# Patient Record
Sex: Male | Born: 1990 | Race: White | Hispanic: No | Marital: Single | Smoking: Never smoker
Health system: Southern US, Community
[De-identification: ages and names within clinical notes are randomized; demographics above are authoritative.]

---

## 2016-06-10 ENCOUNTER — Encounter (HOSPITAL_COMMUNITY): Payer: Self-pay | Admitting: Family Medicine

## 2016-06-10 ENCOUNTER — Ambulatory Visit (HOSPITAL_COMMUNITY)
Admission: EM | Admit: 2016-06-10 | Discharge: 2016-06-10 | Disposition: A | Discharge status: Home / Self Care | Payer: Self-pay | Attending: Emergency Medicine | Admitting: Emergency Medicine

## 2016-06-10 ENCOUNTER — Ambulatory Visit (INDEPENDENT_AMBULATORY_CARE_PROVIDER_SITE_OTHER): Payer: Self-pay

## 2016-06-10 DIAGNOSIS — M25562 Pain in left knee: Secondary | ICD-10-CM

## 2016-06-10 MED ORDER — TRAMADOL HCL 50 MG PO TABS: 50.0000 mg | ORAL_TABLET | Freq: Four times a day (QID) | ORAL | 0 refills | Status: DC | PRN

## 2016-06-10 MED ORDER — PREDNISOLONE 5 MG (48) PO TBPK: 5.0000 mg | ORAL_TABLET | Freq: Every day | ORAL | 0 refills | Status: DC

## 2016-06-10 MED ORDER — PREDNISONE 5 MG (48) PO TBPK: 5.0000 mg | ORAL_TABLET | Freq: Every day | ORAL | 0 refills | Status: DC

## 2016-06-10 MED ORDER — DOXYCYCLINE HYCLATE 100 MG PO CAPS: 100.0000 mg | ORAL_CAPSULE | Freq: Two times a day (BID) | ORAL | 0 refills | Status: DC

## 2016-06-10 NOTE — ED Triage Notes (Signed)
Pt here for right knee pain that started Friday. Denies injury. sts more on the inside of his knee.

## 2016-06-10 NOTE — Discharge Instructions (Signed)
Im worried you have an infection or inflammatory process. Please start both the prednisone and antibiotic tonight and take as directed. Call the orthopedic tomorrow and get in ASAP. If you worsen over night, fevers or more swelling, please go to the ED for emergent evaluation.

## 2016-06-10 NOTE — ED Provider Notes (Signed)
CSN: [id number]     Arrival date & time 06/10/16  1445 History   First MD Initiated Contact with Patient 06/10/16 1547     Chief Complaint  Patient presents with  . Knee Pain   (Consider location/radiation/quality/duration/timing/severity/associated sxs/prior Treatment) 25 yo presents with right knee pain x 2 days. He reports no injury. He awoke to a "very painful", swollen and hot knee. It is painful to flex and walk. He reports a small cut to his knee recently, but no ongoing issues. No recent tick bite, no worries regarding STDs. No known exposures. No fever or chills and otherwise feels well.       History reviewed. No pertinent past medical history. History reviewed. No pertinent surgical history. History reviewed. No pertinent family history. Social History  Substance Use Topics  . Smoking status: Never Smoker  . Smokeless tobacco: Never Used  . Alcohol use Not on file    Review of Systems  Constitutional: Negative for chills, fatigue and fever.  Musculoskeletal: Positive for arthralgias.  Skin: Positive for color change. Negative for rash and wound.    Allergies  Patient has no known allergies.  Home Medications   Prior to Admission medications   Medication Sig Start Date End Date Taking? Authorizing Provider  doxycycline (VIBRAMYCIN) 100 MG capsule Take 1 capsule (100 mg total) by mouth 2 (two) times daily. 06/10/16   Riki SheerMichelle G Efrata Brunner, PA-C  predniSONE (STERAPRED UNI-PAK 48 TAB) 5 MG (48) TBPK tablet Take 1 tablet (5 mg total) by mouth daily. 06/10/16   Riki SheerMichelle G Toran Murch, PA-C  traMADol (ULTRAM) 50 MG tablet Take 1 tablet (50 mg total) by mouth every 6 (six) hours as needed. 06/10/16   Riki SheerMichelle G Athol Bolds, PA-C   Meds Ordered and Administered this Visit  Medications - No data to display  BP 124/79   Pulse 86   Temp 98.1 F (36.7 C) (Oral)   Resp 18   SpO2 98%  No data found.   Physical Exam  Constitutional: He is oriented to person, place, and time. He appears  well-developed and well-nourished. No distress.  HENT:  Head: Normocephalic and atraumatic.  Musculoskeletal:  Right knee with mild soft tissue swelling, warm to touch, no definite effusion, non-tender to palpation, painful and resisted flexion, Healed wound to right anterior knee. No right lower leg edema is noted  Neurological: He is alert and oriented to person, place, and time.  Skin: Skin is warm and dry. He is not diaphoretic. There is erythema.  See M/S exam  Nursing note and vitals reviewed.   Urgent Care Course   Clinical Course     Procedures (including critical care time)  Labs Review Labs Reviewed - No data to display  Imaging Review Dg Knee Complete 4 Views Right  Result Date: 06/10/2016 CLINICAL DATA:  Initial evaluation for acute right knee pain for 2 days. EXAM: RIGHT KNEE - COMPLETE 4+ VIEW COMPARISON:  None. FINDINGS: No acute fracture or dislocation. No significant joint effusion. Osteochondral defect noted at the distal aspect of the lateral femoral condyle al. Mild degenerative osteoarthritic changes within the patellofemoral joint space compartment. There is calcification adjacent to the medial femoral condyles, compatible with a Pellegrini-Stieda lesion, suggesting prior medial collateral ligament injury. No acute soft tissue swelling. Osseous mineralization normal. IMPRESSION: 1. No acute fracture or dislocation. 2. Calcification adjacent to the medial femoral condyle, suggesting prior medial collateral ligament injury (Pellegrini-Stieda lesion). 3. Small osteochondral defect at the distal lateral femoral condyle. 4. Mild patellofemoral  degenerative osteoarthrosis. Electronically Signed   By: Rise MuBenjamin  McClintock M.D.   On: 06/10/2016 16:19     Visual Acuity Review  Right Eye Distance:   Left Eye Distance:   Bilateral Distance:    Right Eye Near:   Left Eye Near:    Bilateral Near:         MDM   1. Acute pain of left knee    Discussed with Dr.  Piedad ClimesHonig. Worried about an inflammatory process vs cellulitis. No distinct effusion is noted and no indication for aspiration. Elected to treat empirically for cellulitis with antibiotic and also treat with prednisone for inflammation. We recommend urgent evaluation with Orthopedics tomorrow, but if concerns of worsening swelling or fevers, then go to the ED for further evaluation. Xray reports are reviewed and appear chronic to this current situation. Further eval by Ortho.     Riki SheerMichelle G Trecia Maring, PA-C 06/10/16 [phone]

## 2016-09-18 ENCOUNTER — Ambulatory Visit (INDEPENDENT_AMBULATORY_CARE_PROVIDER_SITE_OTHER): Payer: PRIVATE HEALTH INSURANCE | Admitting: Urgent Care

## 2016-09-18 VITALS — BP 114/94 | HR 84 | Temp 98.1°F | Resp 18 | Ht 72.0 in | Wt 174.0 lb

## 2016-09-18 DIAGNOSIS — K120 Recurrent oral aphthae: Secondary | ICD-10-CM | POA: Diagnosis not present

## 2016-09-18 DIAGNOSIS — J3089 Other allergic rhinitis: Secondary | ICD-10-CM

## 2016-09-18 DIAGNOSIS — H6983 Other specified disorders of Eustachian tube, bilateral: Secondary | ICD-10-CM

## 2016-09-18 DIAGNOSIS — H9201 Otalgia, right ear: Secondary | ICD-10-CM

## 2016-09-18 MED ORDER — CHLORHEXIDINE GLUCONATE 0.12 % MT SOLN
15.0000 mL | Freq: Two times a day (BID) | OROMUCOSAL | 0 refills | Status: DC
Start: 2016-09-18 — End: 2018-04-01

## 2016-09-18 MED ORDER — CETIRIZINE HCL 10 MG PO TABS: 10.0000 mg | ORAL_TABLET | Freq: Every day | ORAL | 11 refills | Status: DC

## 2016-09-18 MED ORDER — FLUTICASONE PROPIONATE 50 MCG/ACT NA SUSP: 2.0000 | Freq: Every day | NASAL | 11 refills | Status: DC

## 2016-09-18 MED ORDER — PSEUDOEPHEDRINE HCL ER 120 MG PO TB12: 120.0000 mg | ORAL_TABLET | Freq: Two times a day (BID) | ORAL | 3 refills | Status: DC

## 2016-09-18 NOTE — Progress Notes (Signed)
    MRN: [id number] DOB: 16-Jun-1991  Subjective:   Dustin Griffin is a 26 y.o. male, new to our practice, presenting for chief complaint of Ear Fullness and Otitis Media (Possible)  Reports 2 day history of decreased hearing in right ear with intermittent pain. Also started seeing mildly painful ulcer over his inner bottom and upper lips, sore throat, sinus congestion and runny nose. Has tried ibuprofen and Mucinex (for sinuses) with minimal relief. Patient practices good oral hygiene. Denies fever, sinus pain, ear drainage, tinnitus, redness or swelling of outer ear. Denies smoking cigarettes.   Dustin Griffin is not currently taking any medications.  Also has No Known Allergies.  Dustin Griffin has history of allergies. Also denies past surgical history.  Objective:   Vitals: BP (!) 114/94   Pulse 84   Temp 98.1 F (36.7 C) (Oral)   Resp 18   Ht 6' (1.829 m)   Wt 174 lb (78.9 kg)   SpO2 100%   BMI 23.60 kg/m   Physical Exam  Constitutional: He is oriented to person, place, and time. He appears well-developed and well-nourished.  HENT:  TM's flat but intact bilaterally, no effusions or erythema. Nasal turbinates pink and moist, nasal passages patent. No sinus tenderness. Inner bottom lip with mid-left sided aphthous ulcer. Inner upper lip with mid-left sided aphthous ulcer spreading to adjacent upper gum line. There is no erythema, drainage of pus or bleeding. Mucous membranes moist, dentition in good repair.  Eyes: Right eye exhibits no discharge. Left eye exhibits no discharge.  Neck: Normal range of motion. Neck supple.  Cardiovascular: Normal rate, regular rhythm and intact distal pulses.  Exam reveals no gallop and no friction rub.   No murmur heard. Pulmonary/Chest: No respiratory distress. He has no wheezes. He has no rales.  Lymphadenopathy:    He has no cervical adenopathy.  Neurological: He is alert and oriented to person, place, and time.  Skin: Skin is warm and dry.     Assessment and Plan :   1. Right ear pain 2. Dysfunction of both eustachian tubes 3. Allergic rhinitis due to other allergic trigger, unspecified chronicity, unspecified seasonality - Start allergy treatment, use Sudafed as needed. Recheck in 5 days if no improvement.  4. Aphthous ulcer of mouth - Use chlorhexadine mouth rinses.  Wallis BambergMario Camary Sosa, PA-C [facility] [phone] 09/18/2016  3:51 PM

## 2016-09-18 NOTE — Patient Instructions (Addendum)
Canker Sores Introduction Canker sores are small, painful sores that develop inside your mouth. They may also be called aphthous ulcers. You can get canker sores on the inside of your lips or cheeks, on your tongue, or anywhere inside your mouth. You can have just one canker sore or several of them. Canker sores cannot be passed from one person to another (noncontagious). These sores are different than the sores that you may get on the outside of your lips (cold sores or fever blisters). Canker sores usually start as painful red bumps. Then they turn into small white, yellow, or gray ulcers that have red borders. The ulcers may be quite painful. The pain may be worse when you eat or drink. What are the causes? The cause of this condition is not known. What increases the risk? This condition is more likely to develop in:  Women.  People in their teens or 49s.  Women who are having their menstrual period.  People who are under a lot of emotional stress.  People who do not get enough iron or B vitamins.  People who have poor oral hygiene.  People who have an injury inside the mouth. This can happen after having dental work or from chewing something hard. What are the signs or symptoms? Along with the canker sore, symptoms may also include:  Fever.  Fatigue.  Swollen lymph nodes in your neck. How is this diagnosed? This condition can be diagnosed based on your symptoms. Your health care provider will also examine your mouth. Your health care provider may also do tests if you get canker sores often or if they are very bad. Tests may include:  Blood tests to rule out other causes of canker sores.  Taking swabs from the sore to check for infection.  Taking a small piece of skin from the sore (biopsy) to test it for cancer. How is this treated? Most canker sores clear up without treatment in about 10 days. Home care is usually the only treatment that you will need. Over-the-counter  medicines can relieve discomfort.If you have severe canker sores, your health care provider may prescribe:  Numbing ointment to relieve pain.  Vitamins.  Steroid medicines. These may be given as:  Oral pills.  Mouth rinses.  Gels.  Antibiotic mouth rinse. Follow these instructions at home:  Apply, take, or use medicines only as directed by your health care provider. These include vitamins.  If you were prescribed an antibiotic mouth rinse, finish all of it even if you start to feel better.  Until the sores are healed:  Do not drink coffee or citrus juices.  Do not eat spicy or salty foods.  Use a mild, over-the-counter mouth rinse as directed by your health care provider.  Practice good oral hygiene.  Floss your teeth every day.  Brush your teeth with a soft brush twice each day. Contact a health care provider if:  Your symptoms do not get better after two weeks.  You also have a fever or swollen glands.  You get canker sores often.  You have a canker sore that is getting larger.  You cannot eat or drink due to your canker sores. This information is not intended to replace advice given to you by your health care provider. Make sure you discuss any questions you have with your health care provider. Document Released: 11/17/2010 Document Revised: 12/29/2015 Document Reviewed: 06/23/2014  2017 Elsevier   Eustachian Tube Dysfunction Introduction The eustachian tube connects the middle ear to  the back of the nose. It regulates air pressure in the middle ear by allowing air to move between the ear and nose. It also helps to drain fluid from the middle ear space. When the eustachian tube does not function properly, air pressure, fluid, or both can build up in the middle ear. Eustachian tube dysfunction can affect one or both ears. What are the causes? This condition happens when the eustachian tube becomes blocked or cannot open normally. This may result from:  Ear  infections.  Colds and other upper respiratory infections.  Allergies.  Irritation, such as from cigarette smoke or acid from the stomach coming up into the esophagus (gastroesophageal reflux).  Sudden changes in air pressure, such as from descending in an airplane.  Abnormal growths in the nose or throat, such as nasal polyps, tumors, or enlarged tissue at the back of the throat (adenoids). What increases the risk? This condition may be more likely to develop in people who smoke and people who are overweight. Eustachian tube dysfunction may also be more likely to develop in children, especially children who have:  Certain birth defects of the mouth, such as cleft palate.  Large tonsils and adenoids. What are the signs or symptoms? Symptoms of this condition may include:  A feeling of fullness in the ear.  Ear pain.  Clicking or popping noises in the ear.  Ringing in the ear.  Hearing loss.  Loss of balance. Symptoms may get worse when the air pressure around you changes, such as when you travel to an area of high elevation or fly on an airplane. How is this diagnosed? This condition may be diagnosed based on:  Your symptoms.  A physical exam of your ear, nose, and throat.  Tests, such as those that measure:  The movement of your eardrum (tympanogram).  Your hearing (audiometry). How is this treated? Treatment depends on the cause and severity of your condition. If your symptoms are mild, you may be able to relieve your symptoms by moving air into ("popping") your ears. If you have symptoms of fluid in your ears, treatment may include:  Decongestants.  Antihistamines.  Nasal sprays or ear drops that contain medicines that reduce swelling (steroids). In some cases, you may need to have a procedure to drain the fluid in your eardrum (myringotomy). In this procedure, a small tube is placed in the eardrum to:  Drain the fluid.  Restore the air in the middle ear  space. Follow these instructions at home:  Take over-the-counter and prescription medicines only as told by your health care provider.  Use techniques to help pop your ears as recommended by your health care provider. These may include:  Chewing gum.  Yawning.  Frequent, forceful swallowing.  Closing your mouth, holding your nose closed, and gently blowing as if you are trying to blow air out of your nose.  Do not do any of the following until your health care provider approves:  Travel to high altitudes.  Fly in airplanes.  Work in a Estate agent or room.  Scuba dive.  Keep your ears dry. Dry your ears completely after showering or bathing.  Do not smoke.  Keep all follow-up visits as told by your health care provider. This is important. Contact a health care provider if:  Your symptoms do not go away after treatment.  Your symptoms come back after treatment.  You are unable to pop your ears.  You have:  A fever.  Pain in your ear.  Pain in your head or neck.  Fluid draining from your ear.  Your hearing suddenly changes.  You become very dizzy.  You lose your balance. This information is not intended to replace advice given to you by your health care provider. Make sure you discuss any questions you have with your health care provider. Document Released: 08/19/2015 Document Revised: 12/29/2015 Document Reviewed: 08/11/2014  2017 Elsevier  Allergic Rhinitis Allergic rhinitis is when the mucous membranes in the nose respond to allergens. Allergens are particles in the air that cause your body to have an allergic reaction. This causes you to release allergic antibodies. Through a chain of events, these eventually cause you to release histamine into the blood stream. Although meant to protect the body, it is this release of histamine that causes your discomfort, such as frequent sneezing, congestion, and an itchy, runny nose. What are the  causes? Seasonal allergic rhinitis (hay fever) is caused by pollen allergens that may come from grasses, trees, and weeds. Year-round allergic rhinitis (perennial allergic rhinitis) is caused by allergens such as house dust mites, pet dander, and mold spores. What are the signs or symptoms?  Nasal stuffiness (congestion).  Itchy, runny nose with sneezing and tearing of the eyes. How is this diagnosed? Your health care provider can help you determine the allergen or allergens that trigger your symptoms. If you and your health care provider are unable to determine the allergen, skin or blood testing may be used. Your health care provider will diagnose your condition after taking your health history and performing a physical exam. Your health care provider may assess you for other related conditions, such as asthma, pink eye, or an ear infection. How is this treated? Allergic rhinitis does not have a cure, but it can be controlled by:  Medicines that block allergy symptoms. These may include allergy shots, nasal sprays, and oral antihistamines.  Avoiding the allergen. Hay fever may often be treated with antihistamines in pill or nasal spray forms. Antihistamines block the effects of histamine. There are over-the-counter medicines that may help with nasal congestion and swelling around the eyes. Check with your health care provider before taking or giving this medicine. If avoiding the allergen or the medicine prescribed do not work, there are many new medicines your health care provider can prescribe. Stronger medicine may be used if initial measures are ineffective. Desensitizing injections can be used if medicine and avoidance does not work. Desensitization is when a patient is given ongoing shots until the body becomes less sensitive to the allergen. Make sure you follow up with your health care provider if problems continue. Follow these instructions at home: It is not possible to completely avoid  allergens, but you can reduce your symptoms by taking steps to limit your exposure to them. It helps to know exactly what you are allergic to so that you can avoid your specific triggers. Contact a health care provider if:  You have a fever.  You develop a cough that does not stop easily (persistent).  You have shortness of breath.  You start wheezing.  Symptoms interfere with normal daily activities. This information is not intended to replace advice given to you by your health care provider. Make sure you discuss any questions you have with your health care provider. Document Released: 04/17/2001 Document Revised: 03/23/2016 Document Reviewed: 03/30/2013 Elsevier Interactive Patient Education  2017 [employer]   IF you received an x-ray today, you will receive an invoice from [facility]. Please contact [facility] at [phone] with questions or concerns regarding your invoice.   IF you received labwork today, you will receive an invoice from [facility]. Please contact [facility] at [phone] with questions or concerns regarding your invoice.   Our billing staff will not be able to assist you with questions regarding bills from these companies.  You will be contacted with the lab results as soon as they are available. The fastest way to get your results is to activate your My Chart account. Instructions are located on the last page of this paperwork. If you have not heard from [place] regarding the results in 2 weeks, please contact this office.

## 2016-09-24 ENCOUNTER — Ambulatory Visit: Payer: PRIVATE HEALTH INSURANCE

## 2017-05-14 ENCOUNTER — Ambulatory Visit (HOSPITAL_COMMUNITY)
Admission: EM | Admit: 2017-05-14 | Discharge: 2017-05-14 | Disposition: A | Discharge status: Home / Self Care | Payer: PRIVATE HEALTH INSURANCE | Attending: Family Medicine | Admitting: Family Medicine

## 2017-05-14 ENCOUNTER — Encounter (HOSPITAL_COMMUNITY): Payer: Self-pay | Admitting: Emergency Medicine

## 2017-05-14 DIAGNOSIS — J069 Acute upper respiratory infection, unspecified: Secondary | ICD-10-CM | POA: Diagnosis not present

## 2017-05-14 DIAGNOSIS — B9789 Other viral agents as the cause of diseases classified elsewhere: Secondary | ICD-10-CM | POA: Diagnosis not present

## 2017-05-14 MED ORDER — HYDROCODONE-HOMATROPINE 5-1.5 MG/5ML PO SYRP: 5.0000 mL | ORAL_SOLUTION | Freq: Four times a day (QID) | ORAL | 0 refills | Status: DC | PRN

## 2017-05-14 NOTE — ED Provider Notes (Signed)
  [facility]   [id number] 05/14/17 Arrival Time: 1335  ASSESSMENT & PLAN:  1. Viral URI with cough    Meds ordered this encounter  Medications  . HYDROcodone-homatropine (HYCODAN) 5-1.5 MG/5ML syrup    Sig: Take 5 mLs by mouth every 6 (six) hours as needed for cough.    Dispense:  90 mL    Refill:  0   Medication sedation precautions. OTC symptom care as needed. Work note given. Will f/u if not improving over the next several days.  Reviewed expectations re: course of current medical issues. Questions answered. Outlined signs and symptoms indicating need for more acute intervention. Patient verbalized understanding. After Visit Summary given.   SUBJECTIVE:  Dustin Griffin is a 26 y.o. male who presents with complaint of nasal congestion, post-nasal drainage, and a persistent cough. Onset abrupt approximately 2 days ago. Overall fatigued. SOB: none. Wheezing: none. Mild body aches. Non-smoker. No specific aggravating or alleviating factors reported. Cough interfering with sleep. OTC treatment: Ibuprofen with mild help.  ROS: As per HPI.   OBJECTIVE:  Vitals:   05/14/17 1342 05/14/17 1344  BP: 119/78   Pulse: 94   Resp: 16   Temp: 98.2 F (36.8 C)   TempSrc: Oral   SpO2: 99%   Weight: 168 lb (76.2 kg) 168 lb (76.2 kg)  Height: 6' (1.829 m) 6' (1.829 m)     General appearance: alert; no distress HEENT: nasal congestion; clear runny nose; throat irritation secondary to post-nasal drainage Neck: supple without LAD Lungs: clear to auscultation bilaterally; dry cough Skin: warm and dry Psychological: alert and cooperative; normal mood and affect  No Known Allergies   Social History   Social History  . Marital status: Single    Spouse name: N/A  . Number of children: N/A  . Years of education: N/A   Occupational History  . Not on file.   Social History Main Topics  . Smoking status: Never Smoker  . Smokeless tobacco: Never Used  . Alcohol  use Yes  . Drug use: No  . Sexual activity: Not on file   Other Topics Concern  . Not on file   Social History Narrative  . No narrative on file            Mardella Layman, MD 05/14/17 1453

## 2017-05-14 NOTE — ED Triage Notes (Signed)
PT reports sore throat, cough, congestion, pain in chest as of today. PT reports cough is productive.

## 2017-08-13 ENCOUNTER — Ambulatory Visit (HOSPITAL_COMMUNITY)
Admission: EM | Admit: 2017-08-13 | Discharge: 2017-08-13 | Disposition: A | Discharge status: Home / Self Care | Payer: PRIVATE HEALTH INSURANCE | Attending: Internal Medicine | Admitting: Internal Medicine

## 2017-08-13 ENCOUNTER — Encounter (HOSPITAL_COMMUNITY): Payer: Self-pay | Admitting: Emergency Medicine

## 2017-08-13 ENCOUNTER — Other Ambulatory Visit: Payer: Self-pay

## 2017-08-13 DIAGNOSIS — Z113 Encounter for screening for infections with a predominantly sexual mode of transmission: Secondary | ICD-10-CM | POA: Diagnosis not present

## 2017-08-13 DIAGNOSIS — Z202 Contact with and (suspected) exposure to infections with a predominantly sexual mode of transmission: Secondary | ICD-10-CM | POA: Diagnosis not present

## 2017-08-13 MED ORDER — CEFTRIAXONE SODIUM 250 MG IJ SOLR
INTRAMUSCULAR | Status: AC
  Filled 2017-08-13: qty 250

## 2017-08-13 MED ORDER — AZITHROMYCIN 250 MG PO TABS
ORAL_TABLET | ORAL | Status: AC
  Filled 2017-08-13: qty 4

## 2017-08-13 MED ORDER — CEFTRIAXONE SODIUM 250 MG IJ SOLR
250.0000 mg | Freq: Once | INTRAMUSCULAR | Status: AC
  Administered 2017-08-13: 250 mg via INTRAMUSCULAR

## 2017-08-13 MED ORDER — AZITHROMYCIN 250 MG PO TABS
1000.0000 mg | ORAL_TABLET | Freq: Once | ORAL | Status: AC
  Administered 2017-08-13: 1000 mg via ORAL

## 2017-08-13 NOTE — Discharge Instructions (Signed)
You were treated empirically for gonorrhea and chlamydia. Azithromycin 1g by mouth and Rocephin 250mg  injection given in office today. Cytology sent, you will be contacted with any positive results that requires further treatment. Refrain from sexual activity for the next 7 days. Monitor for any worsening of symptoms, fever, abdominal pain, nausea, vomiting, testicular swelling/pain, penile discharge, penile lesion, follow-up for reevaluation.

## 2017-08-13 NOTE — ED Triage Notes (Signed)
Pt states "previous sexual partner told me they had gonorrhea, im not having any symptoms but I just want to be sure". Pt denies symptoms.

## 2017-08-13 NOTE — ED Provider Notes (Signed)
[facility]    CSN: [id number] Arrival date & time: 08/13/17  1524     History   Chief Complaint Chief Complaint  Patient presents with  . Exposure to STD    HPI Dustin Griffin is a 27 y.o. male.   27 year old male comes in for STD check.  Patient states he was told by a recent partner that partner was tested positive for gonorrhea.  Patient's denies any symptoms currently.  Specifically denies testicular swelling/pain, penile lesion, penile discharge.  Denies fever, chills, night sweats.  He does endorse a sore throat, but states his allergies acting up, and does not know if it stated that.  He is not currently sexually active, last sexual activity end of December.  At that time, he was with one male partner, with condom use.      History reviewed. No pertinent past medical history.  There are no active problems to display for this patient.   History reviewed. No pertinent surgical history.     Home Medications    Prior to Admission medications   Medication Sig Start Date End Date Taking? Authorizing Provider  chlorhexidine (PERIDEX) 0.12 % solution Use as directed 15 mLs in the mouth or throat 2 (two) times daily. Patient not taking: Reported on 08/13/2017 09/18/16   Wallis Bamberg, PA-C  fluticasone [facility]) 50 MCG/ACT nasal spray Place 2 sprays into both nostrils daily. Patient not taking: Reported on 08/13/2017 09/18/16   Wallis Bamberg, PA-C  HYDROcodone-homatropine [facility]) 5-1.5 MG/5ML syrup Take 5 mLs by mouth every 6 (six) hours as needed for cough. Patient not taking: Reported on 08/13/2017 05/14/17   Mardella Layman, MD  pseudoephedrine (SUDAFED 12 HOUR) 120 MG 12 hr tablet Take 1 tablet (120 mg total) by mouth 2 (two) times daily. Patient not taking: Reported on 08/13/2017 09/18/16   Wallis Bamberg, PA-C    Family History No family history on file.  Social History Social History   Tobacco Use  . Smoking status: Never Smoker  . Smokeless tobacco: Never Used   Substance Use Topics  . Alcohol use: Yes  . Drug use: No     Allergies   Patient has no known allergies.   Review of Systems Review of Systems  Reason unable to perform ROS: See HPI as above.     Physical Exam Triage Vital Signs ED Triage Vitals [08/13/17 1538]  Enc Vitals Group     BP 125/86     Pulse Rate 72     Resp 18     Temp 98.5 F (36.9 C)     Temp src      SpO2 100 %     Weight      Height      Head Circumference      Peak Flow      Pain Score      Pain Loc      Pain Edu?      Excl. in GC?    No data found.  Updated Vital Signs BP 125/86   Pulse 72   Temp 98.5 F (36.9 C)   Resp 18   SpO2 100%   Physical Exam  Constitutional: He is oriented to person, place, and time. He appears well-developed and well-nourished. No distress.  HENT:  Head: Normocephalic and atraumatic.  Mouth/Throat: Uvula is midline, oropharynx is clear and moist and mucous membranes are normal. No posterior oropharyngeal erythema.  Eyes: Conjunctivae are normal. Pupils are equal, round, and reactive to light.  Neurological: He is alert and oriented to person, place, and time.     UC Treatments / Results  Labs (all labs ordered are listed, but only abnormal results are displayed) Labs Reviewed  CYTOLOGY, (ORAL, ANAL, URETHRAL) ANCILLARY ONLY  URINE CYTOLOGY ANCILLARY ONLY    EKG  EKG Interpretation None       Radiology No results found.  Procedures Procedures (including critical care time)  Medications Ordered in UC Medications  azithromycin (ZITHROMAX) tablet 1,000 mg (1,000 mg Oral Given 08/13/17 1636)  cefTRIAXone (ROCEPHIN) injection 250 mg (250 mg Intramuscular Given 08/13/17 1636)     Initial Impression / Assessment and Plan / UC Course  I have reviewed the triage vital signs and the nursing notes.  Pertinent labs & imaging results that were available during my care of the patient were reviewed by me and considered in my medical decision making (see  chart for details).    Given patient also worries about oral STDs,  oral cytology obtained as well. Patient was treated empirically for GC. Azithromycin and Rocephin given in office today. Cytology sent, patient will be contacted with any positive results that require additional treatment. Patient to refrain from sexual activity for the next 7 days. Return precautions given.   Final Clinical Impressions(s) / UC Diagnoses   Final diagnoses:  STD exposure    ED Discharge Orders    None        Lurline IdolYu, Amy V, PA-C 08/13/17 1637

## 2017-08-14 LAB — URINE CYTOLOGY ANCILLARY ONLY
CHLAMYDIA, DNA PROBE: NEGATIVE
Neisseria Gonorrhea: NEGATIVE
TRICH (WINDOWPATH): NEGATIVE

## 2017-08-14 LAB — CYTOLOGY, (ORAL, ANAL, URETHRAL) ANCILLARY ONLY
Chlamydia: NEGATIVE
NEISSERIA GONORRHEA: NEGATIVE
TRICH (WINDOWPATH): NEGATIVE

## 2018-04-01 ENCOUNTER — Encounter (HOSPITAL_COMMUNITY): Payer: Self-pay | Admitting: Emergency Medicine

## 2018-04-01 ENCOUNTER — Ambulatory Visit (HOSPITAL_COMMUNITY)
Admission: EM | Admit: 2018-04-01 | Discharge: 2018-04-01 | Disposition: A | Discharge status: Home / Self Care | Payer: PRIVATE HEALTH INSURANCE | Attending: Family Medicine | Admitting: Family Medicine

## 2018-04-01 DIAGNOSIS — S91209A Unspecified open wound of unspecified toe(s) with damage to nail, initial encounter: Secondary | ICD-10-CM

## 2018-04-01 DIAGNOSIS — M79674 Pain in right toe(s): Secondary | ICD-10-CM

## 2018-04-01 MED ORDER — MUPIROCIN 2 % EX OINT: 1.0000 "application " | TOPICAL_OINTMENT | Freq: Two times a day (BID) | CUTANEOUS | 0 refills | Status: AC

## 2018-04-01 MED ORDER — CEPHALEXIN 500 MG PO CAPS: 500.0000 mg | ORAL_CAPSULE | Freq: Four times a day (QID) | ORAL | 0 refills | Status: AC

## 2018-04-01 NOTE — ED Provider Notes (Signed)
[facility]    CSN: [id number] Arrival date & time: 04/01/18  1210     History   Chief Complaint Chief Complaint  Patient presents with  . Toe Injury    HPI Dustin Griffin is a 27 y.o. male.   27 year old male comes in for possible infection to the right great toe after injury. States that he jammed his toe 2-3 days ago, causing avulsion of the nail. He dressed the area and taped down the toe. Did not remove dressing until today, and noticed purulent drainage around the nail. No obvious erythema, warmth. Denies fever, chills, night sweats.      History reviewed. No pertinent past medical history.  There are no active problems to display for this patient.   History reviewed. No pertinent surgical history.     Home Medications    Prior to Admission medications   Medication Sig Start Date End Date Taking? Authorizing Provider  cephALEXin (KEFLEX) 500 MG capsule Take 1 capsule (500 mg total) by mouth 4 (four) times daily. 04/01/18   Cathie Hoops, Lorieann Argueta V, PA-C  mupirocin ointment (BACTROBAN) 2 % Apply 1 application topically 2 (two) times daily. 04/01/18   Belinda Fisher, PA-C    Family History History reviewed. No pertinent family history.  Social History Social History   Tobacco Use  . Smoking status: Never Smoker  . Smokeless tobacco: Never Used  Substance Use Topics  . Alcohol use: Yes  . Drug use: No     Allergies   Patient has no known allergies.   Review of Systems Review of Systems  Reason unable to perform ROS: See HPI as above.     Physical Exam Triage Vital Signs ED Triage Vitals [04/01/18 1221]  Enc Vitals Group     BP 135/86     Pulse Rate 80     Resp 18     Temp 98.2 F (36.8 C)     Temp Source Oral     SpO2 100 %     Weight      Height      Head Circumference      Peak Flow      Pain Score      Pain Loc      Pain Edu?      Excl. in GC?    No data found.  Updated Vital Signs BP 135/86 (BP Location: Left Arm)   Pulse 80    Temp 98.2 F (36.8 C) (Oral)   Resp 18   SpO2 100%      Physical Exam  Constitutional: He is oriented to person, place, and time. He appears well-developed and well-nourished. No distress.  HENT:  Head: Normocephalic and atraumatic.  Eyes: Pupils are equal, round, and reactive to light. Conjunctivae are normal.  Musculoskeletal:  Purulent drainage around toe nail of right great toe without erythema/swelling/warmth. Tenderness to palpation along the nail bed. Pedal pulse 2+, cap refill on toe pad <2s  Neurological: He is alert and oriented to person, place, and time.  Skin: He is not diaphoretic.     UC Treatments / Results  Labs (all labs ordered are listed, but only abnormal results are displayed) Labs Reviewed - No data to display  EKG None  Radiology No results found.  Procedures Procedures (including critical care time)  Medications Ordered in UC Medications - No data to display  Initial Impression / Assessment and Plan / UC Course  I have reviewed the triage vital signs  and the nursing notes.  Pertinent labs & imaging results that were available during my care of the patient were reviewed by me and considered in my medical decision making (see chart for details).    Start keflex as directed. Wound care instructions given. Return precautions given. Follow up with Podiatry for further evaluation and monitoring needed.  Final Clinical Impressions(s) / UC Diagnoses   Final diagnoses:  Great toe pain, right  Avulsion of toenail, initial encounter    ED Prescriptions    Medication Sig Dispense Auth. Provider   mupirocin ointment (BACTROBAN) 2 % Apply 1 application topically 2 (two) times daily. 22 g Uday Jantz V, PA-C   cephALEXin (KEFLEX) 500 MG capsule Take 1 capsule (500 mg total) by mouth 4 (four) times daily. 28 capsule Threasa AlphaYu, Avantae Bither V, PA-C        Yaileen Hofferber V, [place]PA-C 04/01/18 1413

## 2018-04-01 NOTE — ED Triage Notes (Signed)
Pt sts right great toe injury to toenail; pt sts thinks could be infected currently

## 2018-04-01 NOTE — Discharge Instructions (Signed)
Start keflex as directed. You can use bactroban to dress the toe. Keep toe clean and dry. Clean with soap and water. Do daily dressing while you are wearing shoes, can keep undressed at home. Warm compress. Follow up with podiatry to monitor healing as needed. If experiencing toe nail deformity, worsening symptoms, follow up with podiatry for further evaluation needed.

## 2019-10-10 ENCOUNTER — Ambulatory Visit: Payer: PRIVATE HEALTH INSURANCE | Attending: Internal Medicine

## 2019-10-10 DIAGNOSIS — Z23 Encounter for immunization: Secondary | ICD-10-CM | POA: Insufficient documentation

## 2019-10-10 NOTE — Progress Notes (Signed)
   Covid-19 Vaccination Clinic  Name:  Dustin Griffin    MRN: [id number] DOB: 1991-03-25  10/10/2019  Mr. Rasmus was observed post Covid-19 immunization for 15 minutes without incident. He was provided with Vaccine Information Sheet and instruction to access the V-Safe system.   Mr. Delvecchio was instructed to call 911 with any severe reactions post vaccine: Marland Kitchen Difficulty breathing  . Swelling of face and throat  . A fast heartbeat  . A bad rash all over body  . Dizziness and weakness   Immunizations Administered    Name Date Dose VIS Date Route   Moderna COVID-19 Vaccine 10/10/2019 10:15 AM 0.5 mL 07/07/2019 Intramuscular   Manufacturer: Moderna   Lot: [id number]   NDC: 00867-619-50

## 2019-11-11 ENCOUNTER — Ambulatory Visit: Payer: PRIVATE HEALTH INSURANCE | Attending: Internal Medicine

## 2019-11-11 DIAGNOSIS — Z23 Encounter for immunization: Secondary | ICD-10-CM

## 2019-11-11 NOTE — Progress Notes (Signed)
   Covid-19 Vaccination Clinic  Name:  Dustin Griffin    MRN: [id number] DOB: 22-Dec-1990  11/11/2019  Dustin Griffin was observed post Covid-19 immunization for 15 minutes without incident. He was provided with Vaccine Information Sheet and instruction to access the V-Safe system.   Dustin Griffin was instructed to call 911 with any severe reactions post vaccine: Marland Kitchen Difficulty breathing  . Swelling of face and throat  . A fast heartbeat  . A bad rash all over body  . Dizziness and weakness   Immunizations Administered    Name Date Dose VIS Date Route   Moderna COVID-19 Vaccine 11/11/2019 10:16 AM 0.5 mL 07/07/2019 Intramuscular   Manufacturer: Gala Murdoch   Lot: [id number]   NDC: 11031-594-58

## 2020-08-25 ENCOUNTER — Other Ambulatory Visit: Payer: Self-pay

## 2020-08-25 ENCOUNTER — Emergency Department (HOSPITAL_COMMUNITY)
Admission: EM | Admit: 2020-08-25 | Discharge: 2020-08-25 | Disposition: A | Discharge status: Home / Self Care | Payer: PRIVATE HEALTH INSURANCE | Attending: Emergency Medicine | Admitting: Emergency Medicine

## 2020-08-25 ENCOUNTER — Emergency Department (HOSPITAL_COMMUNITY): Payer: PRIVATE HEALTH INSURANCE

## 2020-08-25 ENCOUNTER — Encounter (HOSPITAL_COMMUNITY): Payer: Self-pay

## 2020-08-25 DIAGNOSIS — S8012XA Contusion of left lower leg, initial encounter: Secondary | ICD-10-CM | POA: Diagnosis not present

## 2020-08-25 DIAGNOSIS — S0083XA Contusion of other part of head, initial encounter: Secondary | ICD-10-CM

## 2020-08-25 DIAGNOSIS — S6991XA Unspecified injury of right wrist, hand and finger(s), initial encounter: Secondary | ICD-10-CM | POA: Diagnosis not present

## 2020-08-25 DIAGNOSIS — S0993XA Unspecified injury of face, initial encounter: Secondary | ICD-10-CM | POA: Diagnosis present

## 2020-08-25 DIAGNOSIS — S00531A Contusion of lip, initial encounter: Secondary | ICD-10-CM | POA: Diagnosis not present

## 2020-08-25 MED ORDER — IBUPROFEN 200 MG PO TABS
600.0000 mg | ORAL_TABLET | Freq: Once | ORAL | Status: DC
  Filled 2020-08-25: qty 3
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
## 2020-08-25 NOTE — Discharge Instructions (Addendum)
Do NOT blow your nose considering possibility of a nasal bone fracture. You can use saline nasal sprays as needed for congestion.  You can use Afrin as needed for nosebleed and apply steady pressure to your septum. Apply ice to your face for 20 minutes at a time. You can expect to develop more bruising to your face. Apply ice to your wrist and leg for 20 minutes at a time.  Elevate them as much as possible to help with swelling. You can take 600 mg of ibuprofen every 6 hours as needed for pain. Follow closely with your primary care provider. Return for severely worsening headache, vision changes, vomiting, or other concerning symptoms.
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
